# Patient Record
Sex: Male | Born: 1995 | Race: Black or African American | Hispanic: No | Marital: Single | Smoking: Never smoker
Health system: Southern US, Community
[De-identification: ages and names within clinical notes are randomized; demographics above are authoritative.]

## PROBLEM LIST (undated history)

## (undated) DIAGNOSIS — R319 Hematuria, unspecified: Secondary | ICD-10-CM

## (undated) HISTORY — PX: TONSILLECTOMY: SUR1361

## (undated) HISTORY — PX: ADENOIDECTOMY: SHX5191

## (undated) HISTORY — PX: TYMPANOSTOMY TUBE PLACEMENT: SHX32

---
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
## 2019-02-24 ENCOUNTER — Emergency Department (HOSPITAL_BASED_OUTPATIENT_CLINIC_OR_DEPARTMENT_OTHER): Payer: HRSA Program

## 2019-02-24 ENCOUNTER — Encounter (HOSPITAL_BASED_OUTPATIENT_CLINIC_OR_DEPARTMENT_OTHER): Payer: Self-pay | Admitting: Emergency Medicine

## 2019-02-24 ENCOUNTER — Emergency Department (HOSPITAL_BASED_OUTPATIENT_CLINIC_OR_DEPARTMENT_OTHER)
Admission: EM | Admit: 2019-02-24 | Discharge: 2019-02-24 | Disposition: A | Discharge status: Home / Self Care | Payer: HRSA Program | Attending: Emergency Medicine | Admitting: Emergency Medicine

## 2019-02-24 ENCOUNTER — Other Ambulatory Visit: Payer: Self-pay

## 2019-02-24 DIAGNOSIS — J069 Acute upper respiratory infection, unspecified: Secondary | ICD-10-CM | POA: Diagnosis not present

## 2019-02-24 DIAGNOSIS — Z20822 Contact with and (suspected) exposure to covid-19: Secondary | ICD-10-CM

## 2019-02-24 DIAGNOSIS — Z20828 Contact with and (suspected) exposure to other viral communicable diseases: Secondary | ICD-10-CM | POA: Diagnosis not present

## 2019-02-24 DIAGNOSIS — R5383 Other fatigue: Secondary | ICD-10-CM | POA: Diagnosis present

## 2019-02-24 LAB — SARS CORONAVIRUS 2 (TAT 6-24 HRS): SARS Coronavirus 2: NEGATIVE

## 2019-02-24 LAB — SARS CORONAVIRUS 2 AG (30 MIN TAT): SARS Coronavirus 2 Ag: NEGATIVE

## 2019-02-24 LAB — GROUP A STREP BY PCR: Group A Strep by PCR: NOT DETECTED

## 2019-02-24 MED ORDER — SODIUM CHLORIDE 0.9 % IV BOLUS
1000.0000 mL | Freq: Once | INTRAVENOUS | Status: AC
  Administered 2019-02-24: 1000 mL via INTRAVENOUS

## 2019-02-24 MED ORDER — PROMETHAZINE HCL 25 MG PO TABS: 25.0000 mg | ORAL_TABLET | Freq: Four times a day (QID) | ORAL | 0 refills | Status: AC | PRN

## 2019-02-24 MED ORDER — ONDANSETRON HCL 4 MG/2ML IJ SOLN
4.0000 mg | Freq: Once | INTRAMUSCULAR | Status: AC
  Administered 2019-02-24: 4 mg via INTRAVENOUS
  Filled 2019-02-24: qty 2

## 2019-02-24 NOTE — ED Provider Notes (Signed)
[facility] EMERGENCY DEPARTMENT Provider Note   CSN: [id number] Arrival date & time: 02/24/19  0359     History Chief Complaint  Patient presents with  . Fatigue    Jesus Graves is a 24 y.o. male.  HPI     This is a 24 year old male who presents with 3-day history of myalgias, fever, generalized fatigue, shortness of breath, nausea and vomiting.  He was seen and evaluated 2 days ago urgent care.  He reports negative point-of-care testing for COVID-19.  He was given Zofran.  Reports persistent symptoms at home despite Zofran.  He developed worsening shortness of breath.  Has not noted any significant cough.  He also reports sore throat.  Patient reports fevers at home to 103.  He did not take anything for his fevers including Tylenol or ibuprofen.  He has not had any known Covid contacts or sick exposures.  Denies loss of sense of taste or smell.  Past Medical History:  Diagnosis Date  . Hematuria - cause not known     There are no problems to display for this patient.   Past Surgical History:  Procedure Laterality Date  . ADENOIDECTOMY    . TONSILLECTOMY    . TYMPANOSTOMY TUBE PLACEMENT         No family history on file.  Social History   Tobacco Use  . Smoking status: Never Smoker  Substance Use Topics  . Alcohol use: No  . Drug use: Yes    Types: Marijuana    Home Medications Prior to Admission medications   Medication Sig Start Date End Date Taking? Authorizing Provider  ofloxacin (OCUFLOX) 0.3 % ophthalmic solution Place 1 drop into both eyes every 4 (four) hours. 05/25/15   Jesus Pulley, MD  promethazine (PHENERGAN) 25 MG tablet Take 1 tablet (25 mg total) by mouth every 6 (six) hours as needed for nausea or vomiting. 02/24/19   Jesus Graves, Mayer Masker, MD    Allergies    Patient has no known allergies.  Review of Systems   Review of Systems  Constitutional: Positive for fatigue and fever.  Respiratory: Positive for cough and shortness of breath.    Cardiovascular: Negative for chest pain.  Gastrointestinal: Positive for nausea and vomiting. Negative for abdominal pain and diarrhea.  Genitourinary: Negative for dysuria and flank pain.  Musculoskeletal: Negative for back pain.  Skin: Negative for rash.  Neurological: Negative for headaches.  All other systems reviewed and are negative.   Physical Exam Updated Vital Signs BP 114/82 (BP Location: Right Arm)   Pulse 79   Temp 99 F (37.2 C) (Oral)   Resp 20   Ht 1.829 m (6')   Wt 66.7 kg   SpO2 100%   BMI 19.94 kg/m   Physical Exam Vitals and nursing note reviewed.  Constitutional:      Appearance: He is well-developed.  HENT:     Head: Normocephalic and atraumatic.     Mouth/Throat:     Mouth: Mucous membranes are dry.     Comments: Posterior oropharyngeal erythema, no exudate noted Eyes:     Pupils: Pupils are equal, round, and reactive to light.  Cardiovascular:     Rate and Rhythm: Normal rate and regular rhythm.     Heart sounds: Normal heart sounds. No murmur.  Pulmonary:     Effort: Pulmonary effort is normal. No respiratory distress.     Breath sounds: Normal breath sounds. No wheezing.  Abdominal:     General: Bowel sounds  are normal.     Palpations: Abdomen is soft.     Tenderness: There is no abdominal tenderness. There is no guarding or rebound.  Musculoskeletal:     Cervical back: Neck supple.     Right lower leg: No edema.     Left lower leg: No edema.  Lymphadenopathy:     Cervical: Cervical adenopathy present.  Skin:    General: Skin is warm and dry.  Neurological:     Mental Status: He is alert and oriented to person, place, and time.  Psychiatric:        Mood and Affect: Mood normal.     ED Results / Procedures / Treatments   Labs (all labs ordered are listed, but only abnormal results are displayed) Labs Reviewed  GROUP A STREP BY PCR  SARS CORONAVIRUS 2 AG (30 MIN TAT)  SARS CORONAVIRUS 2 (TAT 6-24 HRS)     EKG None  Radiology DG Chest Portable 1 View  Result Date: 02/24/2019 CLINICAL DATA:  Shortness of breath. Dizziness. Fever and chills. EXAM: PORTABLE CHEST 1 VIEW COMPARISON:  Radiograph earlier this day at [facility]. FINDINGS: The cardiomediastinal contours are normal. The lungs are clear. Pulmonary vasculature is normal. No consolidation, pleural effusion, or pneumothorax. No acute osseous abnormalities are seen. IMPRESSION: Unremarkable portable AP view of the chest. No change from radiograph earlier this day at [facility] Electronically Signed   By: Keith Rake M.D.   On: 02/24/2019 06:11    Procedures Procedures (including critical care time)  Medications Ordered in ED Medications  sodium chloride 0.9 % bolus 1,000 mL (0 mLs Intravenous Stopped 02/24/19 0645)  ondansetron (ZOFRAN) injection 4 mg (4 mg Intravenous Given 02/24/19 0523)    ED Course  I have reviewed the triage vital signs and the nursing notes.  Pertinent labs & imaging results that were available during my care of the patient were reviewed by me and considered in my medical decision making (see chart for details).    MDM Rules/Calculators/A&P                       Patient presents with upper respiratory symptoms, nausea, vomiting and shortness of breath.  Was evaluated for COVID-19 and had negative testing 2 days ago.  He is overall nontoxic.  He clinically does appear somewhat dry.  Given his symptoms, will obtain strep testing, repeat Covid testing, chest x-ray to evaluate for pneumonia or pneumothorax.  Patient was given fluids and nausea medication.  Lab work reviewed.  His point-of-care Covid test is negative here.  Strep test is negative.  Chest x-ray shows no evidence of pneumonia.  I discussed with him that he likely still has a viral illness.  It still could be COVID-19 and he should continue to quarantine given current community prevalence.  PCR testing is pending.  He states that  Zofran is not working for him.  Will send home with Phenergan.  After history, exam, and medical workup I feel the patient has been appropriately medically screened and is safe for discharge home. Pertinent diagnoses were discussed with the patient. Patient was given return precautions.   Final Clinical Impression(s) / ED Diagnoses Final diagnoses:  Upper respiratory tract infection, unspecified type  Suspected COVID-19 virus infection    Rx / DC Orders ED Discharge Orders         Ordered    promethazine (PHENERGAN) 25 MG tablet  Every 6 hours PRN  02/24/19 3500           Shon Baton, MD 02/24/19 [phone]

## 2019-02-24 NOTE — Discharge Instructions (Addendum)
You were seen today for upper respiratory symptoms.  Again your initial Covid test is negative.  However, given your symptoms, you should continue to quarantine.  Take Zofran or Phenergan for any nausea and vomiting.  If you develop worsening shortness of breath or any new or worsening symptoms you should be reevaluated.  You should obtain a portable pulse ox at your local drugstore.  If your pulse oximeter gets less than 93% you need to be reevaluated.

## 2019-02-24 NOTE — ED Triage Notes (Signed)
Pt states he is feeling sick for the past 3 days with dizziness, SOB, join and HA with nausea and vomiting. Fever and chills.

## 2021-03-24 ENCOUNTER — Encounter (HOSPITAL_BASED_OUTPATIENT_CLINIC_OR_DEPARTMENT_OTHER): Payer: Self-pay | Admitting: Emergency Medicine

## 2021-03-24 ENCOUNTER — Emergency Department (HOSPITAL_BASED_OUTPATIENT_CLINIC_OR_DEPARTMENT_OTHER): Payer: Worker's Compensation | Attending: Emergency Medicine

## 2021-03-24 ENCOUNTER — Emergency Department (HOSPITAL_BASED_OUTPATIENT_CLINIC_OR_DEPARTMENT_OTHER)
Admission: EM | Admit: 2021-03-24 | Discharge: 2021-03-24 | Disposition: A | Discharge status: Home / Self Care | Payer: No Typology Code available for payment source | Attending: Emergency Medicine | Admitting: Emergency Medicine

## 2021-03-24 ENCOUNTER — Other Ambulatory Visit: Payer: Self-pay

## 2021-03-24 DIAGNOSIS — G8911 Acute pain due to trauma: Secondary | ICD-10-CM | POA: Diagnosis not present

## 2021-03-24 DIAGNOSIS — Y990 Civilian activity done for income or pay: Secondary | ICD-10-CM | POA: Diagnosis not present

## 2021-03-24 DIAGNOSIS — W1789XA Other fall from one level to another, initial encounter: Secondary | ICD-10-CM | POA: Insufficient documentation

## 2021-03-24 DIAGNOSIS — S20229A Contusion of unspecified back wall of thorax, initial encounter: Secondary | ICD-10-CM

## 2021-03-24 DIAGNOSIS — S60222A Contusion of left hand, initial encounter: Secondary | ICD-10-CM

## 2021-03-24 DIAGNOSIS — M79642 Pain in left hand: Secondary | ICD-10-CM | POA: Insufficient documentation

## 2021-03-24 DIAGNOSIS — W19XXXA Unspecified fall, initial encounter: Secondary | ICD-10-CM

## 2021-03-24 DIAGNOSIS — M546 Pain in thoracic spine: Secondary | ICD-10-CM | POA: Insufficient documentation

## 2021-03-24 NOTE — ED Triage Notes (Signed)
Pt slipped in a trailer at work and hit back attempted to break fall and hit hand on rail. Left hand, center and left of back.

## 2021-03-24 NOTE — Discharge Instructions (Signed)
Ice for 20 minutes every 2 hours while awake for the next 2 days.  Take ibuprofen 600 mg every 6 hours as needed for pain.  Wear splint for comfort and support.  Follow-up with primary doctor if not improving in the next week, and return to the ER if symptoms significantly worsen or change.

## 2021-03-24 NOTE — ED Provider Notes (Signed)
[facility] EMERGENCY DEPARTMENT Provider Note   CSN: [id number] Arrival date & time: 03/24/21  5638     History  Chief Complaint  Patient presents with   Back Pain   Hand Injury   Fall    Jesus Graves is a 26 y.o. male.  Patient is a 26 year old male with no significant past medical history.  He presents today for evaluation of fall.  Patient apparently fell from a [employer] truck while at work and landed on his back and left hand.  He describes pain to the thenar aspect of the thumb and pain in his mid back.  He denies any radiation of his pain into his legs.  He denies any difficulty breathing.  He has limited range of motion secondary to pain of the thumb.  The history is provided by the patient.      Home Medications Prior to Admission medications   Medication Sig Start Date End Date Taking? Authorizing Provider  ofloxacin (OCUFLOX) 0.3 % ophthalmic solution Place 1 drop into both eyes every 4 (four) hours. 05/25/15   Lyndal Pulley, MD  promethazine (PHENERGAN) 25 MG tablet Take 1 tablet (25 mg total) by mouth every 6 (six) hours as needed for nausea or vomiting. 02/24/19   Horton, Mayer Masker, MD      Allergies    Patient has no known allergies.    Review of Systems   Review of Systems  All other systems reviewed and are negative.  Physical Exam Updated Vital Signs BP 120/70 (BP Location: Right Arm)    Pulse 68    Temp 97.8 F (36.6 C) (Oral)    Resp 18    Ht 6\' 1"  (1.854 m)    Wt 68 kg    SpO2 100%    BMI 19.79 kg/m  Physical Exam Vitals and nursing note reviewed.  Constitutional:      General: He is not in acute distress.    Appearance: He is well-developed. He is not diaphoretic.  HENT:     Head: Normocephalic and atraumatic.  Cardiovascular:     Rate and Rhythm: Normal rate and regular rhythm.     Heart sounds: No murmur heard.   No friction rub.  Pulmonary:     Effort: Pulmonary effort is normal. No respiratory distress.     Breath sounds: Normal  breath sounds. No wheezing or rales.  Abdominal:     General: Bowel sounds are normal. There is no distension.     Palpations: Abdomen is soft.     Tenderness: There is no abdominal tenderness.  Musculoskeletal:        General: Normal range of motion.     Cervical back: Normal range of motion and neck supple.     Comments: The left hand appears grossly normal.  There is no obvious deformity.  There is tenderness in the thenar eminence and with active or passive range of motion of the thumb.  There are 2 abrasions noted to the mid back.  There is no bony tenderness or step-off.  Skin:    General: Skin is warm and dry.  Neurological:     Mental Status: He is alert and oriented to person, place, and time.     Coordination: Coordination normal.    ED Results / Procedures / Treatments   Labs (all labs ordered are listed, but only abnormal results are displayed) Labs Reviewed - No data to display  EKG None  Radiology No results found.  Procedures Procedures  Medications Ordered in ED Medications - No data to display  ED Course/ Medical Decision Making/ A&P  Patient presenting here with complaints of pain in his back and left hand after falling at work.  Patient's x-rays are unremarkable and there is no deformity or significant swelling on exam.  Patient to be discharged with rest, ice, ibuprofen, and follow-up as needed.  Work excuse given for the next 3 days.  Final Clinical Impression(s) / ED Diagnoses Final diagnoses:  None    Rx / DC Orders ED Discharge Orders     None         Geoffery Lyons, MD 03/24/21 [phone]

## 2023-07-13 IMAGING — DX DG HAND COMPLETE 3+V*L*
3 series · 3 of 3 positions shown · non-contrast
Comparison: None.

CLINICAL DATA: 26-year-old male status post fall with blunt trauma
to the left hand.

EXAM:
LEFT HAND - COMPLETE 3+ VIEW

[hand pa]
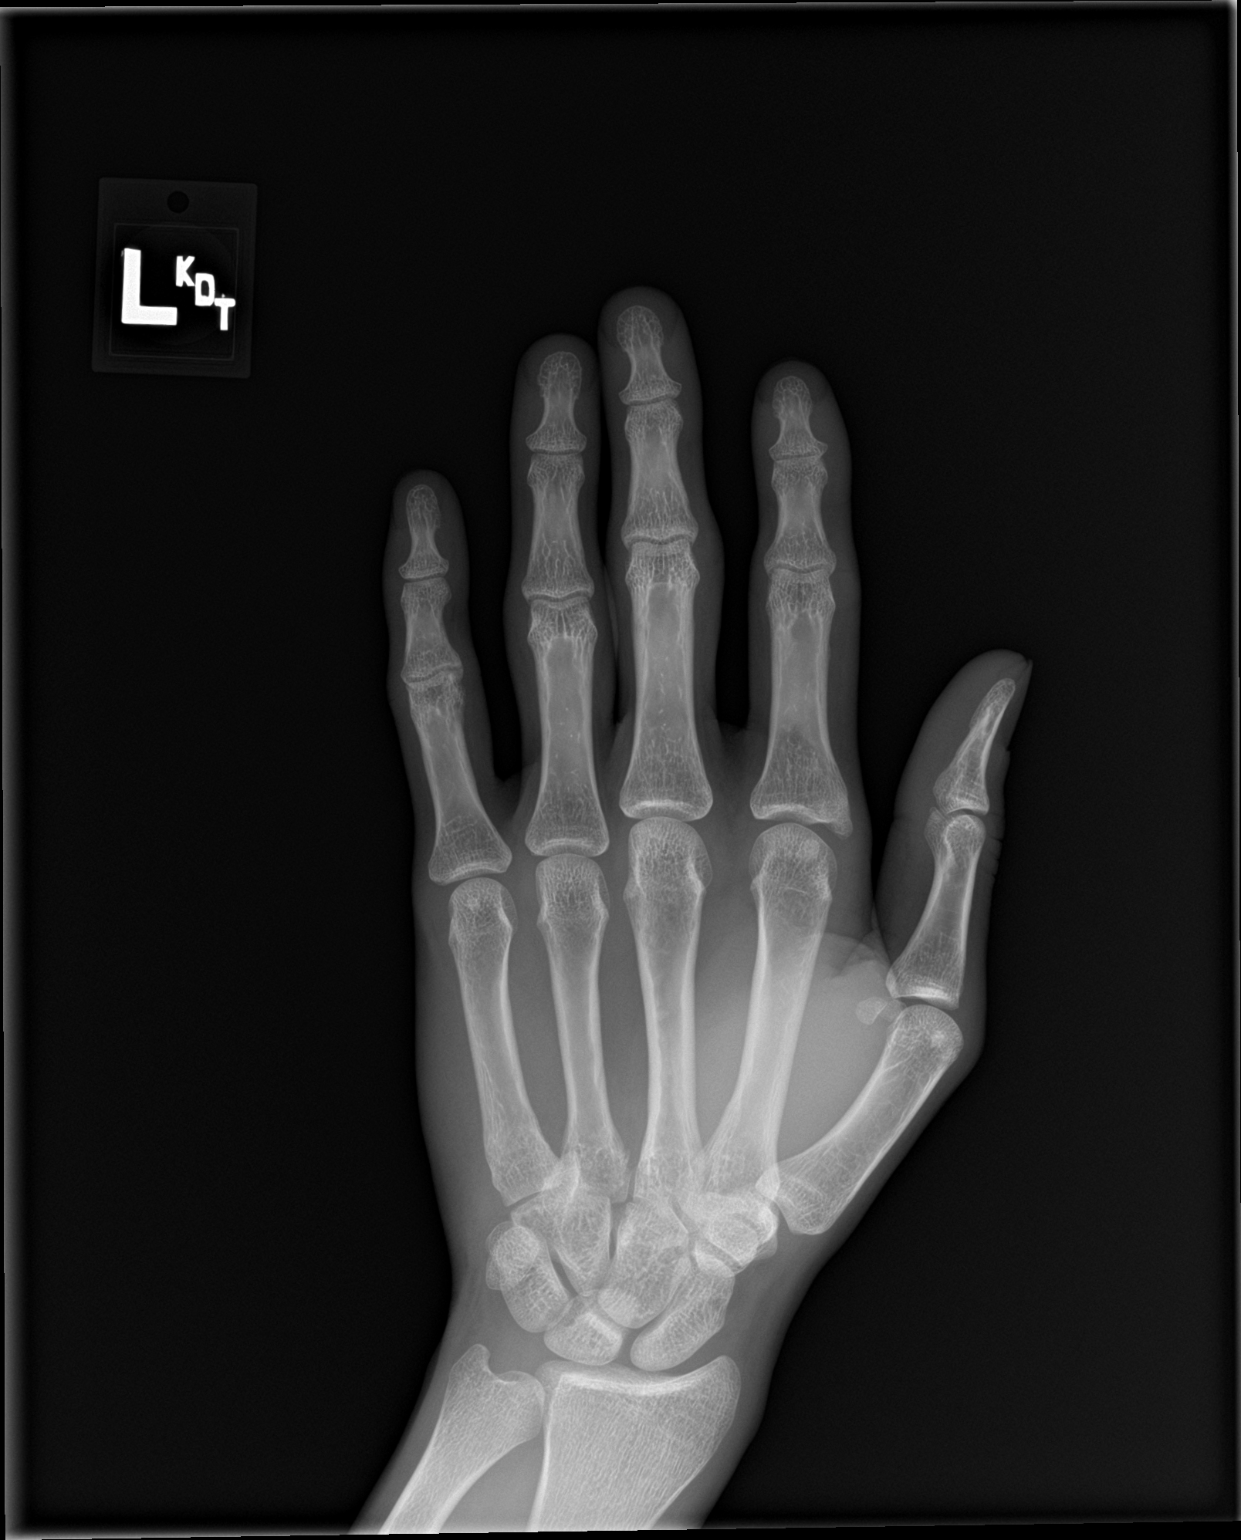

[hand obl]
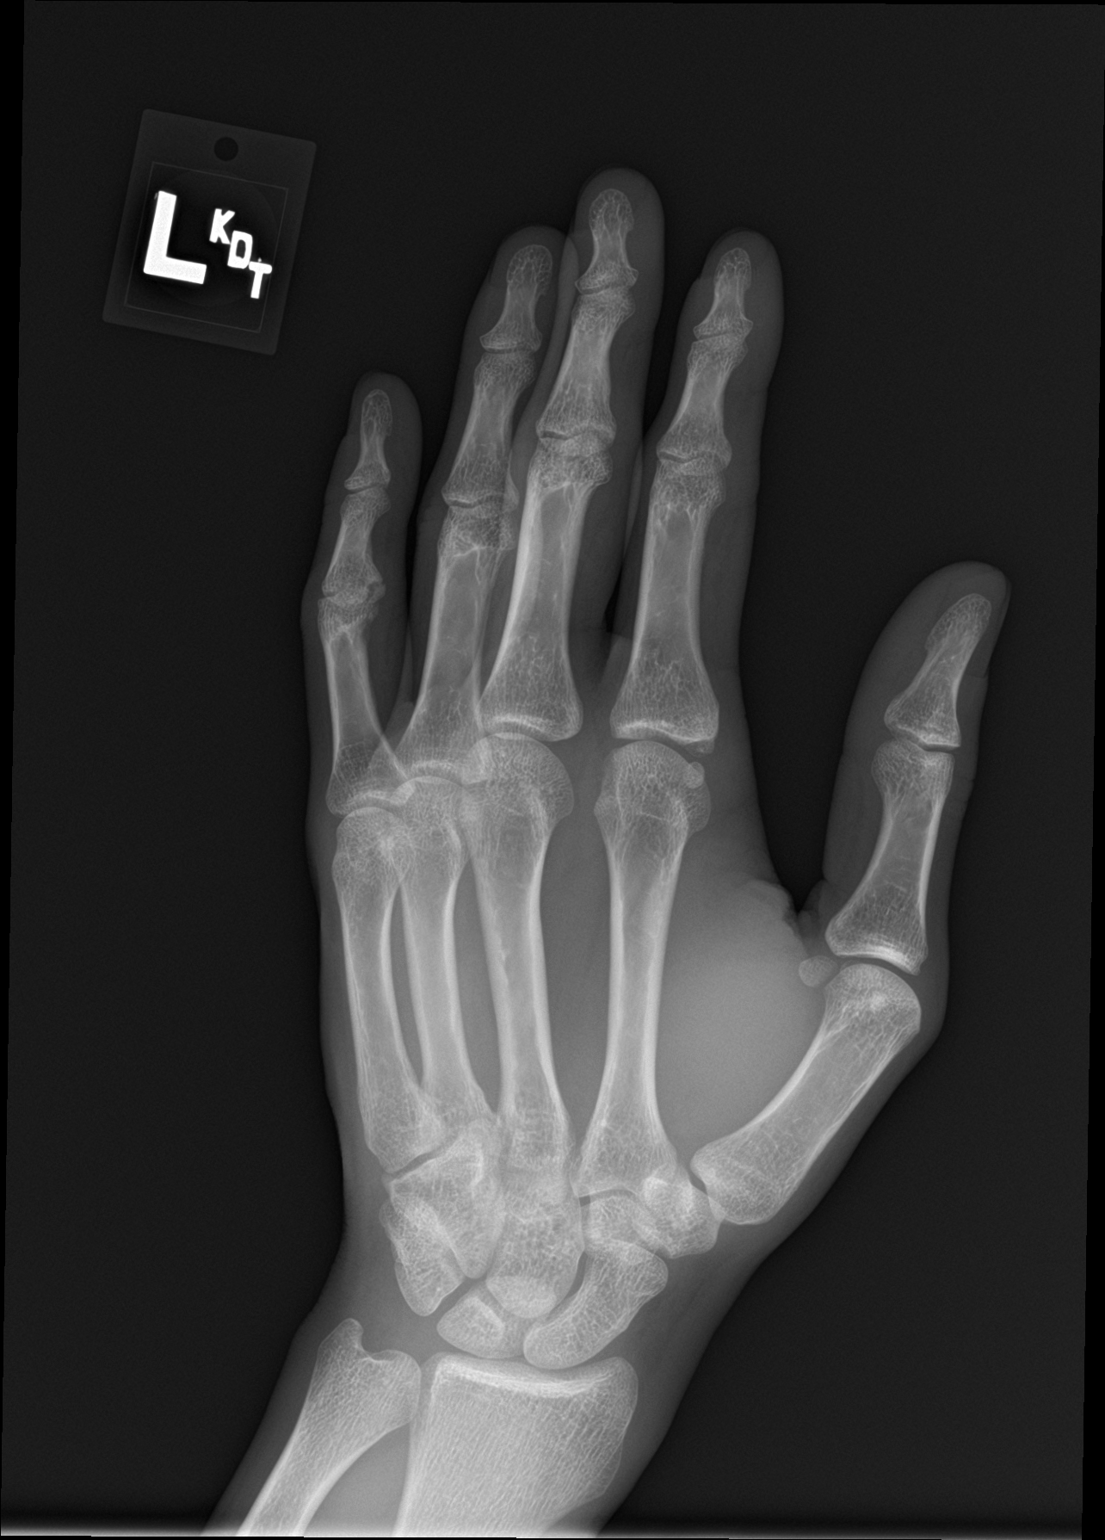

[hand lat]
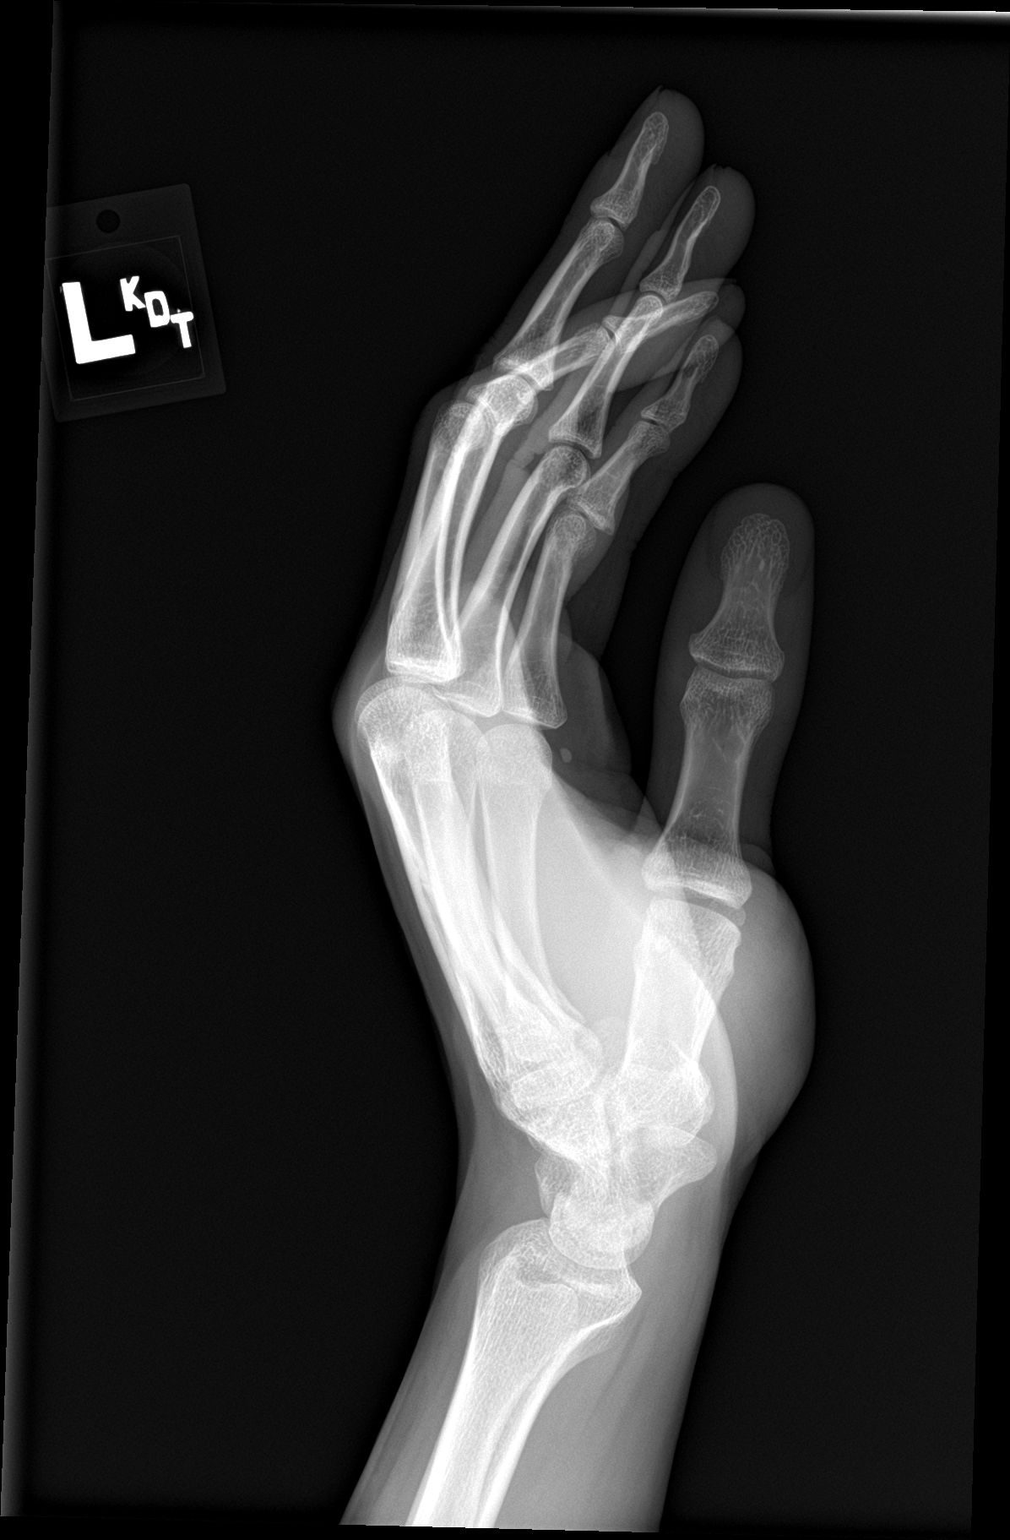

[3 of 3 positions shown; findings below may reference images not displayed]

FINDINGS: Bone mineralization is within normal limits. There is no evidence of
fracture or dislocation. There is no evidence of arthropathy or
other focal bone abnormality. No discrete soft tissue injury.
IMPRESSION: Negative.
